# Patient Record
Sex: Female | Born: 1945 | Race: Black or African American | Hispanic: No | Smoking: Current every day smoker
Health system: Southern US, Community
[De-identification: ages and names within clinical notes are randomized; demographics above are authoritative.]

## PROBLEM LIST (undated history)

## (undated) DIAGNOSIS — G894 Chronic pain syndrome: Secondary | ICD-10-CM

## (undated) DIAGNOSIS — I10 Essential (primary) hypertension: Secondary | ICD-10-CM

## (undated) DIAGNOSIS — E119 Type 2 diabetes mellitus without complications: Secondary | ICD-10-CM

## (undated) DIAGNOSIS — I2510 Atherosclerotic heart disease of native coronary artery without angina pectoris: Secondary | ICD-10-CM

## (undated) DIAGNOSIS — E876 Hypokalemia: Secondary | ICD-10-CM

## (undated) DIAGNOSIS — E785 Hyperlipidemia, unspecified: Secondary | ICD-10-CM

## (undated) DIAGNOSIS — N289 Disorder of kidney and ureter, unspecified: Secondary | ICD-10-CM

## (undated) DIAGNOSIS — J45909 Unspecified asthma, uncomplicated: Secondary | ICD-10-CM

## (undated) DIAGNOSIS — K219 Gastro-esophageal reflux disease without esophagitis: Secondary | ICD-10-CM

## (undated) DIAGNOSIS — J449 Chronic obstructive pulmonary disease, unspecified: Secondary | ICD-10-CM

## (undated) HISTORY — PX: ABDOMINAL HYSTERECTOMY: SHX81

## (undated) HISTORY — PX: BREAST SURGERY: SHX581

## (undated) HISTORY — PX: TONSILLECTOMY: SUR1361

## (undated) HISTORY — PX: SKIN BIOPSY: SHX1

## (undated) HISTORY — PX: APPENDECTOMY: SHX54

---

## 2017-12-22 ENCOUNTER — Other Ambulatory Visit: Payer: Self-pay

## 2017-12-22 ENCOUNTER — Encounter (HOSPITAL_BASED_OUTPATIENT_CLINIC_OR_DEPARTMENT_OTHER): Payer: Self-pay | Admitting: Emergency Medicine

## 2017-12-22 ENCOUNTER — Emergency Department (HOSPITAL_BASED_OUTPATIENT_CLINIC_OR_DEPARTMENT_OTHER)
Admission: EM | Admit: 2017-12-22 | Discharge: 2017-12-22 | Disposition: A | Discharge status: Home / Self Care | Payer: 59 | Attending: Emergency Medicine | Admitting: Emergency Medicine

## 2017-12-22 DIAGNOSIS — Z79899 Other long term (current) drug therapy: Secondary | ICD-10-CM | POA: Diagnosis not present

## 2017-12-22 DIAGNOSIS — E119 Type 2 diabetes mellitus without complications: Secondary | ICD-10-CM | POA: Diagnosis not present

## 2017-12-22 DIAGNOSIS — J449 Chronic obstructive pulmonary disease, unspecified: Secondary | ICD-10-CM | POA: Diagnosis not present

## 2017-12-22 DIAGNOSIS — E785 Hyperlipidemia, unspecified: Secondary | ICD-10-CM | POA: Insufficient documentation

## 2017-12-22 DIAGNOSIS — I2510 Atherosclerotic heart disease of native coronary artery without angina pectoris: Secondary | ICD-10-CM | POA: Insufficient documentation

## 2017-12-22 DIAGNOSIS — I10 Essential (primary) hypertension: Secondary | ICD-10-CM | POA: Insufficient documentation

## 2017-12-22 DIAGNOSIS — M545 Low back pain: Secondary | ICD-10-CM | POA: Diagnosis present

## 2017-12-22 DIAGNOSIS — F17200 Nicotine dependence, unspecified, uncomplicated: Secondary | ICD-10-CM | POA: Insufficient documentation

## 2017-12-22 DIAGNOSIS — Z794 Long term (current) use of insulin: Secondary | ICD-10-CM | POA: Insufficient documentation

## 2017-12-22 DIAGNOSIS — M5441 Lumbago with sciatica, right side: Secondary | ICD-10-CM | POA: Diagnosis not present

## 2017-12-22 HISTORY — DX: Type 2 diabetes mellitus without complications: E11.9

## 2017-12-22 HISTORY — DX: Unspecified asthma, uncomplicated: J45.909

## 2017-12-22 HISTORY — DX: Atherosclerotic heart disease of native coronary artery without angina pectoris: I25.10

## 2017-12-22 HISTORY — DX: Hyperlipidemia, unspecified: E78.5

## 2017-12-22 HISTORY — DX: Chronic obstructive pulmonary disease, unspecified: J44.9

## 2017-12-22 HISTORY — DX: Gastro-esophageal reflux disease without esophagitis: K21.9

## 2017-12-22 HISTORY — DX: Chronic pain syndrome: G89.4

## 2017-12-22 HISTORY — DX: Hypokalemia: E87.6

## 2017-12-22 HISTORY — DX: Disorder of kidney and ureter, unspecified: N28.9

## 2017-12-22 HISTORY — DX: Essential (primary) hypertension: I10

## 2017-12-22 MED ORDER — TRAMADOL HCL 50 MG PO TABS: 50.0000 mg | ORAL_TABLET | Freq: Once | ORAL | Status: DC

## 2017-12-22 MED ORDER — ACETAMINOPHEN 325 MG PO TABS
650.0000 mg | ORAL_TABLET | Freq: Once | ORAL | Status: AC
  Administered 2017-12-22: 650 mg via ORAL
  Filled 2017-12-22: qty 2

## 2017-12-22 MED ORDER — HYDROCODONE-ACETAMINOPHEN 5-325 MG PO TABS: 1.0000 | ORAL_TABLET | Freq: Four times a day (QID) | ORAL | 0 refills | Status: DC | PRN

## 2017-12-22 MED ORDER — HYDROCODONE-ACETAMINOPHEN 5-325 MG PO TABS
1.0000 | ORAL_TABLET | Freq: Once | ORAL | Status: AC
  Administered 2017-12-22: 1 via ORAL
  Filled 2017-12-22: qty 1

## 2017-12-22 MED ORDER — PREDNISONE 20 MG PO TABS: 40.0000 mg | ORAL_TABLET | Freq: Every day | ORAL | 0 refills | Status: AC

## 2017-12-22 MED ORDER — PREDNISONE 20 MG PO TABS
40.0000 mg | ORAL_TABLET | Freq: Once | ORAL | Status: AC
  Administered 2017-12-22: 40 mg via ORAL
  Filled 2017-12-22: qty 2

## 2017-12-22 NOTE — ED Triage Notes (Signed)
Patient states that she has had pain to her right hip and down her right leg x 4 days

## 2017-12-22 NOTE — Discharge Instructions (Addendum)
Return tomorrow at 11am for ultrasound of your right leg.  Please continue taking Tylenol.  I have written you a prescription for Norco and prednisone.  Norco can make you drowsy so please do not drive or work while taking it. Start prednisone daily tomorrow, you received your first dose in the ER today.   You can also apply heat to the lower back to help with your symptoms.  Remember the prednisone can increase your blood sugar, so make sure to check this at home regularly.  Return to the emergency department if you have any new or concerning symptoms like loss of bowel or bladder control, numbness, weakness, unable to walk.

## 2017-12-22 NOTE — ED Provider Notes (Signed)
[facility] EMERGENCY DEPARTMENT Provider Note   CSN: [id number] Arrival date & time: 12/22/17  1757     History   Chief Complaint Chief Complaint  Patient presents with  . Back Pain    HPI Maria Coleman is a 72 y.o. female.  HPI  Maria Coleman is a 72yo female with a history of asthma, COPD, CAD, type 2 diabetes, hypertension, hyperlipidemia who presents to the emergency department for evaluation of back pain.  Patient states that her pain developed about 2 days ago.  Denies inciting injury or strenuous activity.  She reports pain is sharp and shooting, and located primarily over the right lower back.  Pain radiates to the right buttocks and into the posterior right thigh.  She reports pain is worsened with twisting or bending at the waist as well as sitting for long periods.  She has tried taking Tylenol and has applied topical muscle reliever without significant improvement.  She also reports that her right leg has been more swollen than the left over the past 2 days.  Denies calf pain.  She denies history of DVT/PE, exogenous estrogen, active cancer, recent surgery or immobilization, chest pain or shortness of breath.  She denies fevers, chills, unexpected weight changes, night sweats, saddle anesthesia, loss of bowel or bladder control, numbness, weakness, abdominal pain, nausea/vomiting, dysuria, urinary frequency.  She is able to ambulate independently despite pain.   Past Medical History:  Diagnosis Date  . Asthma   . Chronic pain syndrome   . COPD (chronic obstructive pulmonary disease) (HCC)   . Coronary artery disease   . Diabetes mellitus without complication (HCC)   . GERD (gastroesophageal reflux disease)   . Hyperlipidemia   . Hypertension   . Hypokalemia   . Renal disorder     There are no active problems to display for this patient.   Past Surgical History:  Procedure Laterality Date  . ABDOMINAL HYSTERECTOMY    . APPENDECTOMY    . BREAST SURGERY      . SKIN BIOPSY    . TONSILLECTOMY       OB History   None      Home Medications    Prior to Admission medications   Medication Sig Start Date End Date Taking? Authorizing Provider  albuterol (PROVENTIL) (2.5 MG/3ML) 0.083% nebulizer solution Inhale 3 mLs into the lungs. 11/13/17 02/11/18 Yes [provider]  amLODipine (NORVASC) 10 MG tablet Take 1 tablet by mouth. 11/13/17 02/11/18 Yes [provider]  ARIPiprazole (ABILIFY) 5 MG tablet 1 tablet. 11/13/17  Yes [provider]  budesonide-formoterol (SYMBICORT) 160-4.5 MCG/ACT inhaler Inhale 2 puffs into the lungs. 11/13/17 02/11/18 Yes [provider]  butalbital-acetaminophen-caffeine (FIORICET, ESGIC) 50-325-40 MG tablet Take 1 tablet by mouth. 11/13/17  Yes [provider]  empagliflozin (JARDIANCE) 10 MG TABS tablet Take 1 tablet by mouth. 11/13/17 02/11/18 Yes [provider]  fluticasone (FLONASE) 50 MCG/ACT nasal spray 2 sprays. 11/13/17 02/11/18 Yes [provider]  hydrochlorothiazide (HYDRODIURIL) 25 MG tablet Take 1 tablet by mouth. 11/13/17 02/11/18 Yes [provider]  Insulin Glargine (LANTUS SOLOSTAR) 100 UNIT/ML Solostar Pen 27 Units. 12/05/17  Yes [provider]  linagliptin (TRADJENTA) 5 MG TABS tablet 5 mg. 11/13/17  Yes [provider]  omeprazole (PRILOSEC) 40 MG capsule 40 mg. 11/19/17  Yes [provider]  potassium chloride SA (K-DUR,KLOR-CON) 20 MEQ tablet 1 tablet. 11/13/17  Yes [provider]  pregabalin (LYRICA) 50 MG capsule Take 1 capsule  by mouth. 11/13/17 02/11/18 Yes [provider]  rosuvastatin (CRESTOR) 20 MG tablet Take by mouth. 11/13/17 02/11/18 Yes [provider]  tiotropium (SPIRIVA HANDIHALER) 18 MCG inhalation capsule INHALE THE CONTENTS OF ONE CAPSULE EVERY DAY AS DIRECTED 11/13/17  Yes [provider]  tiZANidine (ZANAFLEX) 4 MG tablet Take by mouth. 11/13/17 02/11/18 Yes [provider]   venlafaxine XR (EFFEXOR-XR) 150 MG 24 hr capsule Take by mouth. 11/13/17 02/11/18 Yes [provider]    Family History History reviewed. No pertinent family history.  Social History Social History   Tobacco Use  . Smoking status: Current Every Day Smoker  . Smokeless tobacco: Never Used  Substance Use Topics  . Alcohol use: Never    Frequency: Never  . Drug use: Never     Allergies   Lisinopril   Review of Systems Review of Systems  Constitutional: Negative for chills and fever.  Respiratory: Negative for shortness of breath.   Cardiovascular: Positive for leg swelling (right). Negative for chest pain.  Gastrointestinal: Negative for abdominal pain, nausea and vomiting.  Musculoskeletal: Positive for back pain (right lower). Negative for gait problem and neck pain.  Skin: Negative for rash.  Neurological: Negative for weakness and numbness.  Psychiatric/Behavioral: Negative for agitation.     Physical Exam Updated Vital Signs BP (!) 142/83   Pulse 82   Temp 98.1 F (36.7 C) (Oral)   Resp 18   Ht 5\' 2"  (1.575 m)   Wt 61.7 kg (136 lb)   SpO2 100%   BMI 24.87 kg/m   Physical Exam  Constitutional: She is oriented to person, place, and time. She appears well-developed and well-nourished. No distress.  Sitting at bedside in no apparent distress, nontoxic-appearing.  HENT:  Head: Normocephalic and atraumatic.  Eyes: Right eye exhibits no discharge. Left eye exhibits no discharge.  Cardiovascular: Normal rate and regular rhythm.  Pulmonary/Chest: Effort normal and breath sounds normal. No stridor. No respiratory distress. She has no wheezes. She has no rales.  Abdominal: Soft. Bowel sounds are normal. There is no tenderness.  Well-healed midline surgical scar.  Musculoskeletal:  Tender to palpation over right-sided paraspinal muscles of the lumbar spine.  No overlying rash.  No midline T-spine or L-spine tenderness.  Right ankle and calf appear mildly  swollen compared to left.  No pitting edema, erythema or warmth.  Calf is nontender to palpation.  Strength 5/5 in bilateral knee flexion/extension and ankle dorsiflexion/plantarflexion.  DP pulses 2+ bilaterally.  Neurological: She is alert and oriented to person, place, and time. Coordination normal.  Distal sensation to light touch intact in bilateral lower extremities.  Gait normal and coordination and balance.  Skin: Skin is warm and dry. She is not diaphoretic.  Psychiatric: She has a normal mood and affect. Her behavior is normal.  Nursing note and vitals reviewed.    ED Treatments / Results  Labs (all labs ordered are listed, but only abnormal results are displayed) Labs Reviewed - No data to display  EKG None  Radiology No results found.  Procedures Procedures (including critical care time)  Medications Ordered in ED Medications  acetaminophen (TYLENOL) tablet 650 mg (650 mg Oral Given 12/22/17 2142)  predniSONE (DELTASONE) tablet 40 mg (40 mg Oral Given 12/22/17 2142)  HYDROcodone-acetaminophen (NORCO/VICODIN) 5-325 MG per tablet 1 tablet (1 tablet Oral Given 12/22/17 2142)     Initial Impression / Assessment and Plan / ED Course  I have reviewed the triage vital signs and the nursing notes.  Pertinent labs & imaging results that were available during my care of the patient were reviewed by me and considered in my medical decision making (see chart for details).     Patient presents with multiple complaints  Back Pain No neurological deficits and normal neuro exam. No loss of bowel or bladder control. No concern for cauda equina. No fever, night sweats, weight loss, h/o cancer. Relph protocol and pain medicine indicated and discussed with patient.  Have counseled her that steroid can increase her blood sugar and she will have to check her sugars at home more frequently. Patient agrees to the above plan and appears reliable for follow up.  Right Leg Swelling Ongoing  for the past two days, primarily in the right ankle and lower leg. On exam there is mild right lower leg swelling compared to left. No signs of infection. Given exam findings plan to get venous US for further evaluation. Unfortunately, [place] team is not here at this time of day. Low suspicion for DVT given no identifiable risk factors and lack of pain over the calf, but will have patient return in the morning for [place]. Engaged in shared decision making with patient who declines Lovenox tonight and would like to wait until the morning for definitive diagnosis if this is a DVT. I think this is reasonable. No CP, SOB, tachycardia or hypoxia and do not suspect PE. Discussed this patient with Dr. Criss Alvine who agrees with plan for [place] in the AM.   Final Clinical Impressions(s) / ED Diagnoses   Final diagnoses:  Acute right-sided low back pain with right-sided sciatica    ED Discharge Orders        Ordered    HYDROcodone-acetaminophen (NORCO/VICODIN) 5-325 MG tablet  Every 6 hours PRN     12/22/17 2134    predniSONE (DELTASONE) 20 MG tablet  Daily     12/22/17 2134       Kellie Shropshire, PA-C 12/23/17 6045    Pricilla Loveless, MD 12/24/17 [phone]

## 2017-12-22 NOTE — ED Notes (Signed)
Pt states she has a hx of back issues. Presents today c/o right flank pain radiating into right leg.

## 2017-12-23 ENCOUNTER — Ambulatory Visit (HOSPITAL_BASED_OUTPATIENT_CLINIC_OR_DEPARTMENT_OTHER)
Admit: 2017-12-23 | Discharge: 2017-12-23 | Disposition: A | Discharge status: Home / Self Care | Payer: 59 | Attending: Emergency Medical Services | Admitting: Emergency Medical Services

## 2017-12-23 ENCOUNTER — Other Ambulatory Visit (HOSPITAL_BASED_OUTPATIENT_CLINIC_OR_DEPARTMENT_OTHER): Payer: Self-pay | Admitting: Emergency Medical Services

## 2017-12-23 DIAGNOSIS — R600 Localized edema: Secondary | ICD-10-CM

## 2017-12-23 MED FILL — HYDROCODON-APAP 5-325: 5-325 | 3 days supply | Qty: 12 | Fill #0

## 2017-12-23 MED FILL — predniSONE 20 MG TABS: 20 | 4 days supply | Qty: 8 | Fill #0

## 2017-12-24 ENCOUNTER — Ambulatory Visit (HOSPITAL_BASED_OUTPATIENT_CLINIC_OR_DEPARTMENT_OTHER)
Admission: RE | Admit: 2017-12-24 | Discharge: 2017-12-24 | Disposition: A | Discharge status: Home / Self Care | Payer: 59 | Source: Ambulatory Visit | Attending: Emergency Medical Services | Admitting: Emergency Medical Services

## 2017-12-24 DIAGNOSIS — E1122 Type 2 diabetes mellitus with diabetic chronic kidney disease: Secondary | ICD-10-CM | POA: Diagnosis not present

## 2017-12-24 DIAGNOSIS — N189 Chronic kidney disease, unspecified: Secondary | ICD-10-CM | POA: Diagnosis not present

## 2017-12-24 DIAGNOSIS — E785 Hyperlipidemia, unspecified: Secondary | ICD-10-CM | POA: Insufficient documentation

## 2017-12-24 DIAGNOSIS — R600 Localized edema: Secondary | ICD-10-CM | POA: Insufficient documentation

## 2017-12-24 DIAGNOSIS — I129 Hypertensive chronic kidney disease with stage 1 through stage 4 chronic kidney disease, or unspecified chronic kidney disease: Secondary | ICD-10-CM | POA: Diagnosis not present

## 2017-12-24 DIAGNOSIS — J449 Chronic obstructive pulmonary disease, unspecified: Secondary | ICD-10-CM | POA: Insufficient documentation

## 2018-06-18 ENCOUNTER — Emergency Department (HOSPITAL_BASED_OUTPATIENT_CLINIC_OR_DEPARTMENT_OTHER)
Admission: EM | Admit: 2018-06-18 | Discharge: 2018-06-19 | Disposition: A | Discharge status: Home / Self Care | Payer: 59 | Attending: Emergency Medicine | Admitting: Emergency Medicine

## 2018-06-18 ENCOUNTER — Other Ambulatory Visit: Payer: Self-pay

## 2018-06-18 ENCOUNTER — Emergency Department (HOSPITAL_BASED_OUTPATIENT_CLINIC_OR_DEPARTMENT_OTHER): Payer: 59

## 2018-06-18 ENCOUNTER — Encounter (HOSPITAL_BASED_OUTPATIENT_CLINIC_OR_DEPARTMENT_OTHER): Payer: Self-pay | Admitting: Emergency Medicine

## 2018-06-18 DIAGNOSIS — M79602 Pain in left arm: Secondary | ICD-10-CM | POA: Insufficient documentation

## 2018-06-18 DIAGNOSIS — Z79899 Other long term (current) drug therapy: Secondary | ICD-10-CM | POA: Diagnosis not present

## 2018-06-18 DIAGNOSIS — E876 Hypokalemia: Secondary | ICD-10-CM | POA: Diagnosis not present

## 2018-06-18 DIAGNOSIS — I10 Essential (primary) hypertension: Secondary | ICD-10-CM | POA: Diagnosis not present

## 2018-06-18 DIAGNOSIS — M5412 Radiculopathy, cervical region: Secondary | ICD-10-CM

## 2018-06-18 DIAGNOSIS — E119 Type 2 diabetes mellitus without complications: Secondary | ICD-10-CM | POA: Diagnosis not present

## 2018-06-18 DIAGNOSIS — F17210 Nicotine dependence, cigarettes, uncomplicated: Secondary | ICD-10-CM | POA: Diagnosis not present

## 2018-06-18 DIAGNOSIS — J449 Chronic obstructive pulmonary disease, unspecified: Secondary | ICD-10-CM | POA: Diagnosis not present

## 2018-06-18 DIAGNOSIS — Z794 Long term (current) use of insulin: Secondary | ICD-10-CM | POA: Insufficient documentation

## 2018-06-18 LAB — CBC WITH DIFFERENTIAL/PLATELET
Abs Immature Granulocytes: 0.02 10*3/uL (ref 0.00–0.07)
BASOS ABS: 0 10*3/uL (ref 0.0–0.1)
BASOS PCT: 0 %
EOS ABS: 0.1 10*3/uL (ref 0.0–0.5)
Eosinophils Relative: 2 %
HEMATOCRIT: 45.5 % (ref 36.0–46.0)
Hemoglobin: 14.1 g/dL (ref 12.0–15.0)
IMMATURE GRANULOCYTES: 0 %
LYMPHS ABS: 2.8 10*3/uL (ref 0.7–4.0)
Lymphocytes Relative: 33 %
MCH: 27.7 pg (ref 26.0–34.0)
MCHC: 31 g/dL (ref 30.0–36.0)
MCV: 89.4 fL (ref 80.0–100.0)
MONOS PCT: 7 %
Monocytes Absolute: 0.6 10*3/uL (ref 0.1–1.0)
NEUTROS ABS: 4.9 10*3/uL (ref 1.7–7.7)
Neutrophils Relative %: 58 %
PLATELETS: 282 10*3/uL (ref 150–400)
RBC: 5.09 MIL/uL (ref 3.87–5.11)
RDW: 13.6 % (ref 11.5–15.5)
WBC: 8.5 10*3/uL (ref 4.0–10.5)
nRBC: 0 % (ref 0.0–0.2)

## 2018-06-18 LAB — BASIC METABOLIC PANEL
ANION GAP: 9 (ref 5–15)
BUN: 17 mg/dL (ref 8–23)
CHLORIDE: 101 mmol/L (ref 98–111)
CO2: 28 mmol/L (ref 22–32)
Calcium: 8.5 mg/dL — ABNORMAL LOW (ref 8.9–10.3)
Creatinine, Ser: 0.89 mg/dL (ref 0.44–1.00)
GFR calc Af Amer: >60 mL/min (ref >60)
GFR calc non Af Amer: >60 mL/min (ref >60)
GLUCOSE: 114 mg/dL — AB (ref 70–99)
Potassium: 2.8 mmol/L — ABNORMAL LOW (ref 3.5–5.1)
Sodium: 138 mmol/L (ref 135–145)

## 2018-06-18 MED ORDER — POTASSIUM CHLORIDE CRYS ER 20 MEQ PO TBCR
80.0000 meq | EXTENDED_RELEASE_TABLET | Freq: Once | ORAL | Status: AC
  Administered 2018-06-19: 80 meq via ORAL
  Filled 2018-06-18: qty 4

## 2018-06-18 MED ORDER — HYDROCODONE-ACETAMINOPHEN 5-325 MG PO TABS
1.0000 | ORAL_TABLET | Freq: Once | ORAL | Status: AC
  Administered 2018-06-18: 1 via ORAL
  Filled 2018-06-18: qty 1

## 2018-06-18 NOTE — ED Triage Notes (Addendum)
Pt reports L arm pain and swelling from L shoulder top elbow for three days. Reports"knots" in arm. States OTC creams/meds not effective - states "I tried a bit of everything."

## 2018-06-18 NOTE — ED Notes (Signed)
ED Provider at bedside. 

## 2018-06-18 NOTE — ED Notes (Signed)
Woke w left arm pain 3 days ago  Per pt increased swell and knots in arm  Denies inj

## 2018-06-18 NOTE — ED Provider Notes (Signed)
[facility] EMERGENCY DEPARTMENT Provider Note   CSN: [id number] Arrival date & time: 06/18/18  2249     History   Chief Complaint Chief Complaint  Patient presents with  . Arm Pain    HPI Maria Coleman is a 72 y.o. female.  The history is provided by the patient.  Arm Pain  This is a new problem. The current episode started more than 2 days ago. The problem occurs daily. The problem has been gradually worsening. Pertinent negatives include no shortness of breath. Exacerbated by: Movement. The symptoms are relieved by rest. Treatments tried: OTC meds. The treatment provided no relief.  Reports gradual onset of left arm pain and swelling 3 days ago.  She first noticed it after waking up.  Denies any trauma.  She reports pain shoots down her left arm.  There is also associated neck pain.  She describes swelling in the arm and "knots" in her arm and she thinks she has a blood clot.  No previous history of DVT.  She has no indwelling catheters.   Past Medical History:  Diagnosis Date  . Asthma   . Chronic pain syndrome   . COPD (chronic obstructive pulmonary disease) (HCC)   . Coronary artery disease   . Diabetes mellitus without complication (HCC)   . GERD (gastroesophageal reflux disease)   . Hyperlipidemia   . Hypertension   . Hypokalemia   . Renal disorder     There are no active problems to display for this patient.   Past Surgical History:  Procedure Laterality Date  . ABDOMINAL HYSTERECTOMY    . APPENDECTOMY    . BREAST SURGERY    . SKIN BIOPSY    . TONSILLECTOMY       OB History   None      Home Medications    Prior to Admission medications   Medication Sig Start Date End Date Taking? Authorizing Provider  albuterol (PROVENTIL) (2.5 MG/3ML) 0.083% nebulizer solution Inhale 3 mLs into the lungs. 11/13/17 02/11/18  [provider]  amLODipine (NORVASC) 10 MG tablet Take 1 tablet by mouth. 11/13/17 02/11/18  [provider]    ARIPiprazole (ABILIFY) 5 MG tablet 1 tablet. 11/13/17   [provider]  budesonide-formoterol (SYMBICORT) 160-4.5 MCG/ACT inhaler Inhale 2 puffs into the lungs. 11/13/17 02/11/18  [provider]  butalbital-acetaminophen-caffeine (FIORICET, ESGIC) 50-325-40 MG tablet Take 1 tablet by mouth. 11/13/17   [provider]  fluticasone (FLONASE) 50 MCG/ACT nasal spray 2 sprays. 11/13/17 02/11/18  [provider]  hydrochlorothiazide (HYDRODIURIL) 25 MG tablet Take 1 tablet by mouth. 11/13/17 02/11/18  [provider]  Insulin Glargine (LANTUS SOLOSTAR) 100 UNIT/ML Solostar Pen 27 Units. 12/05/17   [provider]  linagliptin (TRADJENTA) 5 MG TABS tablet 5 mg. 11/13/17   [provider]  omeprazole (PRILOSEC) 40 MG capsule 40 mg. 11/19/17   [provider]  potassium chloride SA (K-DUR,KLOR-CON) 20 MEQ tablet 1 tablet. 11/13/17   [provider]  pregabalin (LYRICA) 50 MG capsule Take 1 capsule by mouth. 11/13/17 02/11/18  [provider]  rosuvastatin (CRESTOR) 20 MG tablet Take by mouth. 11/13/17 02/11/18  [provider]  tiotropium (SPIRIVA HANDIHALER) 18 MCG inhalation capsule INHALE THE CONTENTS OF ONE CAPSULE EVERY DAY AS DIRECTED 11/13/17   [provider]  venlafaxine XR (EFFEXOR-XR) 150 MG 24 hr capsule Take by mouth. 11/13/17 02/11/18  [provider]    Family History History reviewed. No pertinent family history.  Social History Social History   Tobacco Use  . Smoking status: Current Every Day Smoker    Packs/day: 0.50    Types: Cigarettes  . Smokeless tobacco: Never Used  Substance Use Topics  . Alcohol use: Not Currently    Frequency: Never  . Drug use: Never     Allergies   Lisinopril and Ibuprofen   Review of Systems Review of Systems  Constitutional: Negative for fever.  Respiratory: Negative for shortness of breath.   Gastrointestinal: Negative for vomiting.  Musculoskeletal:  Positive for arthralgias.  Neurological:       Paresthesias left arm  All other systems reviewed and are negative.    Physical Exam Updated Vital Signs BP (!) 154/88 (BP Location: Right Arm)   Pulse 96   Temp 98.1 F (36.7 C) (Oral)   Resp (!) 22   Ht 1.6 m (5\' 3" )   Wt 74.4 kg   SpO2 96%   BMI 29.05 kg/m   Physical Exam CONSTITUTIONAL: Elderly, anxious HEAD: Normocephalic/atraumatic EYES: EOMI/PERRL ENMT: Mucous membranes moist NECK: supple no meningeal signs SPINE/BACK:entire spine nontender, cervical paraspinal tenderness CV: S1/S2 noted, no murmurs/rubs/gallops noted LUNGS: Lungs are clear to auscultation bilaterally, no apparent distress ABDOMEN: soft, nontender, no rebound or guarding, bowel sounds noted throughout abdomen GU:no cva tenderness NEURO: Pt is awake/alert/appropriate, moves all extremitiesx4.  No facial droop.   Decreased hand grip in left hand. Equal power wrist flex/extension, elbow flex/extension, and equal power with shoulder abduction/adduction.   EXTREMITIES: pulses normal/equal, full ROM, distal pulses equal intact in both arms.  Arms appear symmetric.  There is no erythema, no abscesses noted to the left arm.  No obvious edema in the left arm.  She has diffuse tenderness to the left bicep and left forearm.  No axillary tenderness or lymphadenopathy. SKIN: warm, color normal PSYCH: Anxious  ED Treatments / Results  Labs (all labs ordered are listed, but only abnormal results are displayed) Labs Reviewed  BASIC METABOLIC PANEL - Abnormal; Notable for the following components:      Result Value   Potassium 2.8 (*)    Glucose, Bld 114 (*)    Calcium 8.5 (*)    All other components within normal limits  CBC WITH DIFFERENTIAL/PLATELET    EKG None  Radiology US Venous Img Upper Uni Left  Result Date: 06/19/2018 CLINICAL DATA:  72 year old female with left upper extremity swelling and pain. EXAM: Left UPPER EXTREMITY VENOUS DOPPLER  ULTRASOUND TECHNIQUE: Gray-scale sonography with graded compression, as well as color Doppler and duplex ultrasound were performed to evaluate the upper extremity deep venous system from the level of the subclavian vein and including the jugular, axillary, basilic, radial, ulnar and upper cephalic vein. Spectral Doppler was utilized to evaluate flow at rest and with distal augmentation maneuvers. COMPARISON:  None. FINDINGS: Contralateral Subclavian Vein: Respiratory phasicity is normal and symmetric with the symptomatic side. No evidence of thrombus. Normal compressibility. Internal Jugular Vein: No evidence of thrombus. Normal compressibility, respiratory phasicity and response to augmentation. Subclavian Vein: No evidence of thrombus. Normal compressibility, respiratory phasicity and response to augmentation. Axillary Vein: No evidence of thrombus. Normal compressibility, respiratory phasicity and response to augmentation. Cephalic Vein: No evidence of thrombus. Normal compressibility, respiratory phasicity and response to augmentation. Basilic Vein: No evidence of thrombus. Normal compressibility, respiratory phasicity and response to augmentation. Brachial Veins: No evidence of thrombus. Normal compressibility, respiratory phasicity and response to augmentation. Radial Veins: Suboptimally evaluated on color Doppler. Flow however is demonstrated on spectral  Doppler. Ulnar Veins: Suboptimally evaluated on color Doppler with demonstrable flow on spectral Doppler. Venous Reflux:  None visualized. Other Findings:  None visualized. IMPRESSION: No evidence of DVT within the left upper extremity. Electronically Signed   By: Elgie CollardArash  Radparvar M.D.   On: 06/19/2018 00:34    Procedures Procedures    Medications Ordered in ED Medications  HYDROcodone-acetaminophen (NORCO/VICODIN) 5-325 MG per tablet 1 tablet (1 tablet Oral Given 06/18/18 2323)     Initial Impression / Assessment and Plan / ED Course  I have  reviewed the triage vital signs and the nursing notes.  Pertinent labs  results that were available during my care of the patient were reviewed by me and considered in my medical decision making (see chart for details). Narcotic database reviewed and considered in decision making    11:39 PM Patient presents for left arm pain and reported swelling.  I do not see any significant edema on my exam.  She is convinced that she has a DVT.  She also endorses neck pain that radiates into the arm with paresthesias, potentially could be cervical radiculopathy.  Imaging and labs are pending at this time 12:45 AM Imaging negative for DVT.  No signs of cellulitis.  Minimal weakness of left hand grip, no other focal neuro deficits.  Strong suspicion for radiculopathy. Referred her to her spinal specialist.  Final Clinical Impressions(s) / ED Diagnoses   Final diagnoses:  Left arm pain  Cervical radiculopathy  Hypokalemia    ED Discharge Orders         Ordered    HYDROcodone-acetaminophen (NORCO/VICODIN) 5-325 MG tablet  Every 6 hours PRN     06/19/18 0045           Zadie RhineWickline, Zay Yeargan, MD 06/19/18 [id number]

## 2018-06-19 MED ORDER — HYDROCODONE-ACETAMINOPHEN 5-325 MG PO TABS: 1.0000 | ORAL_TABLET | Freq: Four times a day (QID) | ORAL | 0 refills | Status: AC | PRN

## 2020-01-27 ENCOUNTER — Other Ambulatory Visit: Payer: Self-pay

## 2020-01-27 ENCOUNTER — Encounter (HOSPITAL_BASED_OUTPATIENT_CLINIC_OR_DEPARTMENT_OTHER): Payer: Self-pay | Admitting: Emergency Medicine

## 2020-01-27 ENCOUNTER — Emergency Department (HOSPITAL_BASED_OUTPATIENT_CLINIC_OR_DEPARTMENT_OTHER)
Admission: EM | Admit: 2020-01-27 | Discharge: 2020-01-27 | Disposition: A | Discharge status: Home / Self Care | Payer: Medicare (Managed Care) | Attending: Emergency Medicine | Admitting: Emergency Medicine

## 2020-01-27 DIAGNOSIS — L0291 Cutaneous abscess, unspecified: Secondary | ICD-10-CM

## 2020-01-27 DIAGNOSIS — F17210 Nicotine dependence, cigarettes, uncomplicated: Secondary | ICD-10-CM | POA: Diagnosis not present

## 2020-01-27 DIAGNOSIS — K651 Peritoneal abscess: Secondary | ICD-10-CM | POA: Diagnosis not present

## 2020-01-27 DIAGNOSIS — Z79899 Other long term (current) drug therapy: Secondary | ICD-10-CM | POA: Insufficient documentation

## 2020-01-27 DIAGNOSIS — L03818 Cellulitis of other sites: Secondary | ICD-10-CM | POA: Diagnosis not present

## 2020-01-27 DIAGNOSIS — J45909 Unspecified asthma, uncomplicated: Secondary | ICD-10-CM | POA: Insufficient documentation

## 2020-01-27 DIAGNOSIS — I10 Essential (primary) hypertension: Secondary | ICD-10-CM | POA: Diagnosis not present

## 2020-01-27 MED ORDER — OXYCODONE-ACETAMINOPHEN 5-325 MG PO TABS: 2.0000 | ORAL_TABLET | ORAL | 0 refills | Status: AC | PRN

## 2020-01-27 MED ORDER — SULFAMETHOXAZOLE-TRIMETHOPRIM 800-160 MG PO TABS: 1.0000 | ORAL_TABLET | Freq: Two times a day (BID) | ORAL | 0 refills | Status: AC

## 2020-01-27 MED ORDER — SULFAMETHOXAZOLE-TRIMETHOPRIM 800-160 MG PO TABS
1.0000 | ORAL_TABLET | Freq: Once | ORAL | Status: AC
  Administered 2020-01-27: 1 via ORAL
  Filled 2020-01-27: qty 1

## 2020-01-27 MED ORDER — HYDROMORPHONE HCL 1 MG/ML IJ SOLN
1.0000 mg | Freq: Once | INTRAMUSCULAR | Status: AC
  Administered 2020-01-27: 1 mg via INTRAMUSCULAR
  Filled 2020-01-27: qty 1

## 2020-01-27 MED ORDER — LIDOCAINE-EPINEPHRINE 1 %-1:100000 IJ SOLN
20.0000 mL | Freq: Once | INTRAMUSCULAR | Status: AC
  Administered 2020-01-27: 20 mL via INTRADERMAL
  Filled 2020-01-27: qty 1

## 2020-01-27 NOTE — ED Triage Notes (Signed)
Pt states she has a boil on her inner upper left thigh x 3 days

## 2020-01-27 NOTE — Discharge Instructions (Signed)
I have placed a packing in your abscess.  Please leave this in place for at least 1 to 2 days and then remove it.  This is to help keep the wound open so that the pus can drain out.  The axilla comes up before that that is okay.  Make sure you do sitz bath a couple times a day.  Follow-up with your primary doctor in a few days to ensure improvement.

## 2020-01-28 NOTE — ED Provider Notes (Signed)
[facility] EMERGENCY DEPARTMENT Provider Note   CSN: [id number] Arrival date & time: 01/27/20  0119     History Chief Complaint  Patient presents with  . Abscess    Maria Coleman is a 74 y.o. female.   Abscess Location:  Pelvis Pelvic abscess location:  Perineum Size:  2x2 Abscess quality: draining, fluctuance, induration, itching, painful, redness and warmth   Duration:  3 days Progression:  Worsening Pain details:    Quality:  Pressure and sharp   Severity:  Mild   Timing:  Constant   Progression:  Worsening Chronicity:  New Relieved by:  None tried      Past Medical History:  Diagnosis Date  . Asthma   . Chronic pain syndrome   . COPD (chronic obstructive pulmonary disease) (HCC)   . Coronary artery disease   . Diabetes mellitus without complication (HCC)   . GERD (gastroesophageal reflux disease)   . Hyperlipidemia   . Hypertension   . Hypokalemia   . Renal disorder     There are no problems to display for this patient.   Past Surgical History:  Procedure Laterality Date  . ABDOMINAL HYSTERECTOMY    . APPENDECTOMY    . BREAST SURGERY    . SKIN BIOPSY    . TONSILLECTOMY       OB History   No obstetric history on file.     Family History  Problem Relation Age of Onset  . Cancer Brother     Social History   Tobacco Use  . Smoking status: Current Every Day Smoker    Packs/day: 0.50    Types: Cigarettes  . Smokeless tobacco: Never Used  Vaping Use  . Vaping Use: Never used  Substance Use Topics  . Alcohol use: Not Currently  . Drug use: Never    Home Medications Prior to Admission medications   Medication Sig Start Date End Date Taking? Authorizing Provider  albuterol (PROVENTIL) (2.5 MG/3ML) 0.083% nebulizer solution Inhale 3 mLs into the lungs. 11/13/17 02/11/18  [provider]  amLODipine (NORVASC) 10 MG tablet Take 1 tablet by mouth. 11/13/17 02/11/18  [provider]  ARIPiprazole (ABILIFY) 5 MG tablet  1 tablet. 11/13/17   [provider]  budesonide-formoterol (SYMBICORT) 160-4.5 MCG/ACT inhaler Inhale 2 puffs into the lungs. 11/13/17 02/11/18  [provider]  butalbital-acetaminophen-caffeine (FIORICET, ESGIC) 50-325-40 MG tablet Take 1 tablet by mouth. 11/13/17   [provider]  fluticasone (FLONASE) 50 MCG/ACT nasal spray 2 sprays. 11/13/17 02/11/18  [provider]  hydrochlorothiazide (HYDRODIURIL) 25 MG tablet Take 1 tablet by mouth. 11/13/17 02/11/18  [provider]  HYDROcodone-acetaminophen (NORCO/VICODIN) 5-325 MG tablet Take 1 tablet by mouth every 6 (six) hours as needed for severe pain. 06/19/18   Zadie Rhine, MD  Insulin Glargine (LANTUS SOLOSTAR) 100 UNIT/ML Solostar Pen 27 Units. 12/05/17   [provider]  linagliptin (TRADJENTA) 5 MG TABS tablet 5 mg. 11/13/17   [provider]  omeprazole (PRILOSEC) 40 MG capsule 40 mg. 11/19/17   [provider]  oxyCODONE-acetaminophen (PERCOCET) 5-325 MG tablet Take 2 tablets by mouth every 4 (four) hours as needed. 01/27/20   Steele Stracener, Barbara Cower, MD  potassium chloride SA (K-DUR,KLOR-CON) 20 MEQ tablet 1 tablet. 11/13/17   [provider]  pregabalin (LYRICA) 50 MG capsule Take 1 capsule by mouth. 11/13/17 02/11/18  [provider]  rosuvastatin (CRESTOR) 20 MG tablet Take by mouth. 11/13/17 02/11/18  [provider]  sulfamethoxazole-trimethoprim (BACTRIM DS) 800-160  MG tablet Take 1 tablet by mouth 2 (two) times daily for 7 days. 01/27/20 02/03/20  Al Bracewell, Barbara Cower, MD  tiotropium (SPIRIVA HANDIHALER) 18 MCG inhalation capsule INHALE THE CONTENTS OF ONE CAPSULE EVERY DAY AS DIRECTED 11/13/17   [provider]  venlafaxine XR (EFFEXOR-XR) 150 MG 24 hr capsule Take by mouth. 11/13/17 02/11/18  [provider]    Allergies    Lisinopril and Ibuprofen  Review of Systems   Review of Systems  All other systems reviewed and are negative.   Physical  Exam Updated Vital Signs BP 117/72 (BP Location: Right Arm)   Pulse 90   Temp 98.9 F (37.2 C) (Oral)   Resp 14   Ht 5\' 2"  (1.575 m)   Wt 74.8 kg   SpO2 92%   BMI 30.18 kg/m   Physical Exam Vitals and nursing note reviewed.  Constitutional:      Appearance: She is well-developed.  HENT:     Head: Normocephalic and atraumatic.     Mouth/Throat:     Mouth: Mucous membranes are moist.     Pharynx: Oropharynx is clear.  Eyes:     Pupils: Pupils are equal, round, and reactive to light.  Cardiovascular:     Rate and Rhythm: Normal rate and regular rhythm.  Pulmonary:     Effort: No respiratory distress.     Breath sounds: No stridor.  Abdominal:     General: Abdomen is flat. There is no distension.  Musculoskeletal:        General: No swelling or tenderness. Normal range of motion.     Cervical back: Normal range of motion.  Skin:    General: Skin is warm and dry.     Comments: Abscess and cellulitis noted in her perineal area with some purulent drainage.  Did not seem to involve the labia  Neurological:     General: No focal deficit present.     Mental Status: She is alert.     ED Results / Procedures / Treatments   Labs (all labs ordered are listed, but only abnormal results are displayed) Labs Reviewed - No data to display  EKG None  Radiology No results found.  Procedures . Incision and Drainage  Date/Time: 01/28/2020 2:32 AM Performed by: 01/30/2020, MD Authorized by: Marily Memos, MD   Consent:    Consent obtained:  Verbal   Consent given by:  Patient   Risks discussed:  Bleeding, incomplete drainage and damage to other organs   Alternatives discussed:  No treatment, delayed treatment and alternative treatment Location:    Type:  Abscess   Location:  Anogenital   Anogenital location:  Perineum Pre-procedure details:    Skin preparation:  Betadine Anesthesia (see MAR for exact dosages):    Anesthesia method:  Local infiltration   Local  anesthetic:  Lidocaine 1% WITH epi Procedure type:    Complexity:  Simple Procedure details:    Needle aspiration: no     Incision types:  Single straight   Scalpel blade:  11   Wound management:  Probed and deloculated and irrigated with saline   Drainage:  Bloody and purulent   Drainage amount:  Moderate   Wound treatment:  Wound left open   Packing materials:  1/4 in gauze   Amount 1/4":  3" Post-procedure details:    Patient tolerance of procedure:  Tolerated well, no immediate complications   (including critical care time)  Medications Ordered in ED Medications  lidocaine-EPINEPHrine (XYLOCAINE W/EPI) 1 %-1:100000 (  with pres) injection 20 mL (20 mLs Intradermal Given 01/27/20 0254)  HYDROmorphone (DILAUDID) injection 1 mg (1 mg Intramuscular Given 01/27/20 0255)  sulfamethoxazole-trimethoprim (BACTRIM DS) 800-160 MG per tablet 1 tablet (1 tablet Oral Given 01/27/20 0434)    ED Course  I have reviewed the triage vital signs and the nursing notes.  Pertinent labs & imaging results that were available during my care of the patient were reviewed by me and considered in my medical decision making (see chart for details).    MDM Rules/Calculators/A&P                          Abscess and cellulitis started on antibiotics given prescription for pain medicine.  Abscess drained as above.  Discussed when to take the wick out and when to follow-up with her doctor or here.  Final Clinical Impression(s) / ED Diagnoses Final diagnoses:  Cellulitis of other specified site  Abscess    Rx / DC Orders ED Discharge Orders         Ordered    sulfamethoxazole-trimethoprim (BACTRIM DS) 800-160 MG tablet  2 times daily     Discontinue  Reprint     01/27/20 0427    oxyCODONE-acetaminophen (PERCOCET) 5-325 MG tablet  Every 4 hours PRN     Discontinue  Reprint     01/27/20 0427           Lucie Friedlander, Barbara Cower, MD 01/28/20 [phone]

## 2020-12-10 ENCOUNTER — Encounter (HOSPITAL_BASED_OUTPATIENT_CLINIC_OR_DEPARTMENT_OTHER): Payer: Self-pay | Admitting: Emergency Medicine

## 2020-12-10 ENCOUNTER — Other Ambulatory Visit: Payer: Self-pay

## 2020-12-10 ENCOUNTER — Emergency Department (HOSPITAL_BASED_OUTPATIENT_CLINIC_OR_DEPARTMENT_OTHER): Payer: Medicare (Managed Care)

## 2020-12-10 ENCOUNTER — Emergency Department (HOSPITAL_BASED_OUTPATIENT_CLINIC_OR_DEPARTMENT_OTHER)
Admission: EM | Admit: 2020-12-10 | Discharge: 2020-12-10 | Disposition: A | Discharge status: Home / Self Care | Payer: Medicare (Managed Care) | Attending: Emergency Medicine | Admitting: Emergency Medicine

## 2020-12-10 DIAGNOSIS — I2510 Atherosclerotic heart disease of native coronary artery without angina pectoris: Secondary | ICD-10-CM | POA: Insufficient documentation

## 2020-12-10 DIAGNOSIS — M542 Cervicalgia: Secondary | ICD-10-CM | POA: Diagnosis present

## 2020-12-10 DIAGNOSIS — E119 Type 2 diabetes mellitus without complications: Secondary | ICD-10-CM | POA: Diagnosis not present

## 2020-12-10 DIAGNOSIS — I10 Essential (primary) hypertension: Secondary | ICD-10-CM | POA: Insufficient documentation

## 2020-12-10 DIAGNOSIS — M546 Pain in thoracic spine: Secondary | ICD-10-CM | POA: Diagnosis not present

## 2020-12-10 DIAGNOSIS — J45909 Unspecified asthma, uncomplicated: Secondary | ICD-10-CM | POA: Diagnosis not present

## 2020-12-10 DIAGNOSIS — F17210 Nicotine dependence, cigarettes, uncomplicated: Secondary | ICD-10-CM | POA: Insufficient documentation

## 2020-12-10 DIAGNOSIS — Z794 Long term (current) use of insulin: Secondary | ICD-10-CM | POA: Diagnosis not present

## 2020-12-10 DIAGNOSIS — R0781 Pleurodynia: Secondary | ICD-10-CM | POA: Diagnosis not present

## 2020-12-10 DIAGNOSIS — M545 Low back pain, unspecified: Secondary | ICD-10-CM | POA: Diagnosis not present

## 2020-12-10 DIAGNOSIS — J449 Chronic obstructive pulmonary disease, unspecified: Secondary | ICD-10-CM | POA: Insufficient documentation

## 2020-12-10 DIAGNOSIS — Z79899 Other long term (current) drug therapy: Secondary | ICD-10-CM | POA: Insufficient documentation

## 2020-12-10 DIAGNOSIS — Y92410 Unspecified street and highway as the place of occurrence of the external cause: Secondary | ICD-10-CM | POA: Diagnosis not present

## 2020-12-10 DIAGNOSIS — Z7984 Long term (current) use of oral hypoglycemic drugs: Secondary | ICD-10-CM | POA: Insufficient documentation

## 2020-12-10 DIAGNOSIS — Z7951 Long term (current) use of inhaled steroids: Secondary | ICD-10-CM | POA: Insufficient documentation

## 2020-12-10 MED ORDER — ACETAMINOPHEN 325 MG PO TABS
650.0000 mg | ORAL_TABLET | Freq: Once | ORAL | Status: AC
  Administered 2020-12-10: 650 mg via ORAL
  Filled 2020-12-10: qty 2
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
## 2020-12-10 NOTE — ED Notes (Signed)
Patient transported to X-ray 
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
## 2024-01-08 ENCOUNTER — Other Ambulatory Visit (HOSPITAL_COMMUNITY): Admission: RE | Admit: 2024-01-08 | Source: Home / Self Care
